# Patient Record
Sex: Male | Born: 1965 | Race: White | Hispanic: No | Marital: Married | Smoking: Never smoker
Health system: Southern US, Community
[De-identification: ages and names within clinical notes are randomized; demographics above are authoritative.]

## PROBLEM LIST (undated history)

## (undated) HISTORY — PX: TONSILLECTOMY: SUR1361

---

## 1998-10-20 ENCOUNTER — Inpatient Hospital Stay (HOSPITAL_COMMUNITY): Admission: EM | Admit: 1998-10-20 | Discharge: 1998-10-22 | Payer: Self-pay | Admitting: Emergency Medicine

## 1998-10-20 ENCOUNTER — Encounter: Payer: Self-pay | Admitting: General Surgery

## 2007-05-10 ENCOUNTER — Encounter: Admission: RE | Admit: 2007-05-10 | Discharge: 2007-05-10 | Payer: Self-pay | Admitting: Family Medicine

## 2008-10-31 ENCOUNTER — Encounter: Admission: RE | Admit: 2008-10-31 | Discharge: 2008-10-31 | Payer: Self-pay | Admitting: Family Medicine

## 2010-02-03 IMAGING — US US ABDOMEN COMPLETE
1 series · 14 of 25 positions shown · non-contrast
Comparison: None

CLINICAL DATA: Elevated LFTs

ABDOMEN ULTRASOUND
TECHNIQUE: Complete abdominal ultrasound examination was performed
including evaluation of the liver, gallbladder, bile ducts,
pancreas, kidneys, spleen, IVC, and abdominal aorta.

[Series 1: us abdomen complete · 0.36mm/px · 14 of 66 slices shown]
[im 1/66]
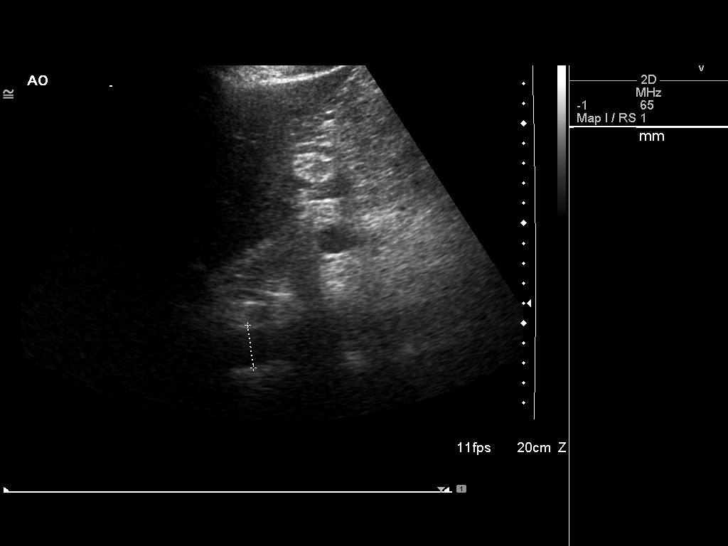
[im 6/66]
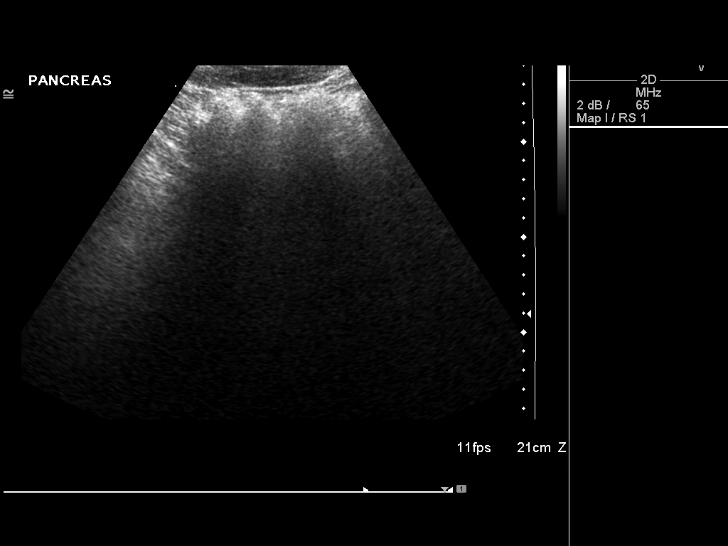
[im 11/66]
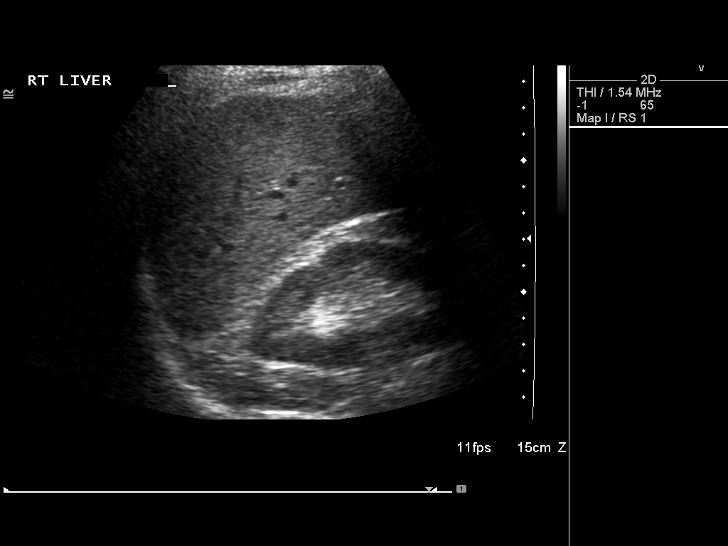
[im 17/66]
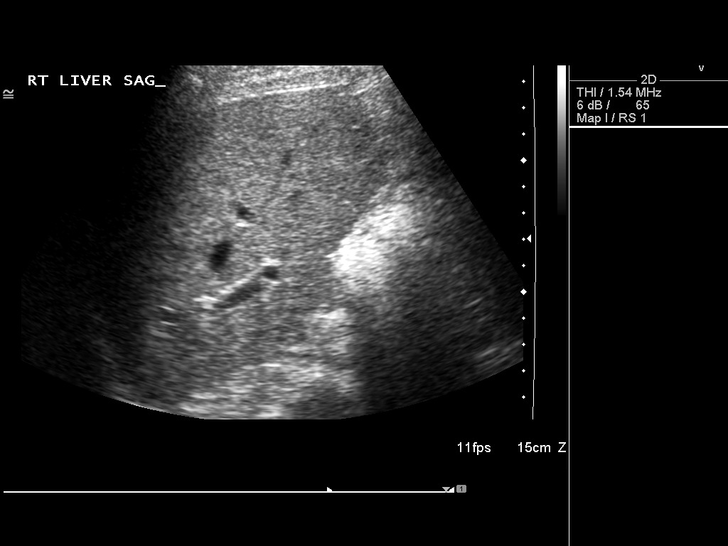
[im 22/66]
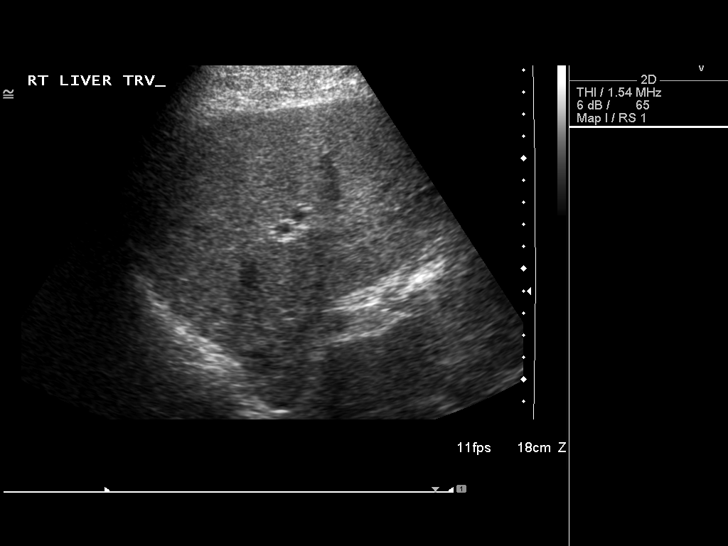
[im 25/66]
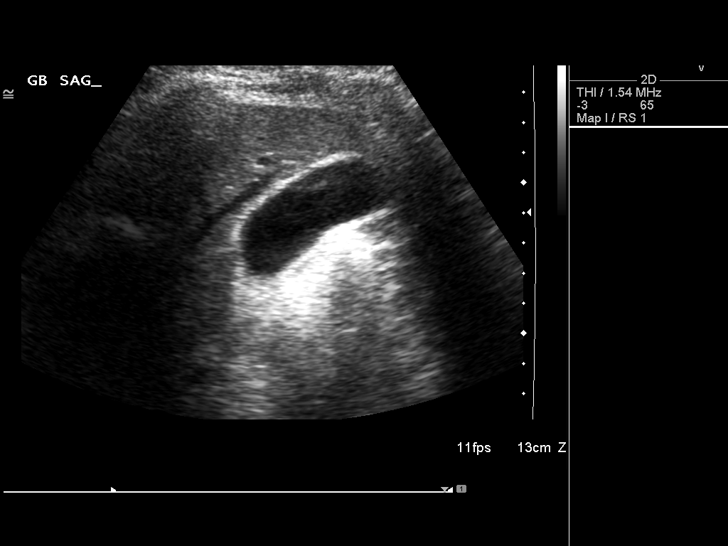
[im 30/66]
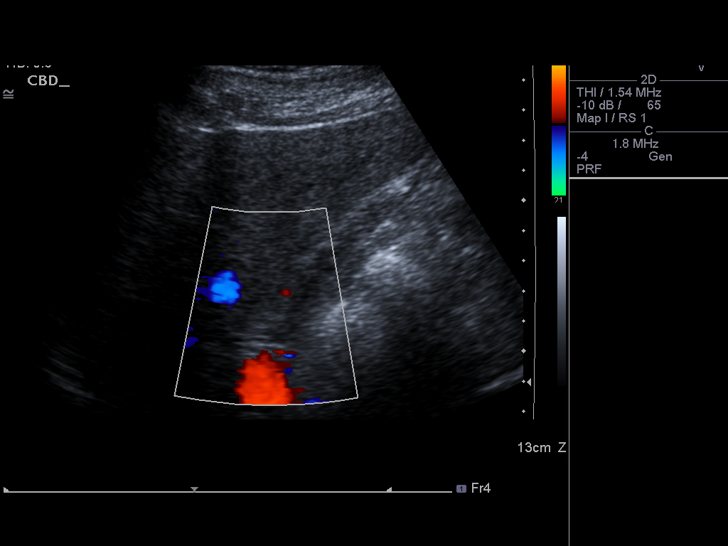
[im 36/66]
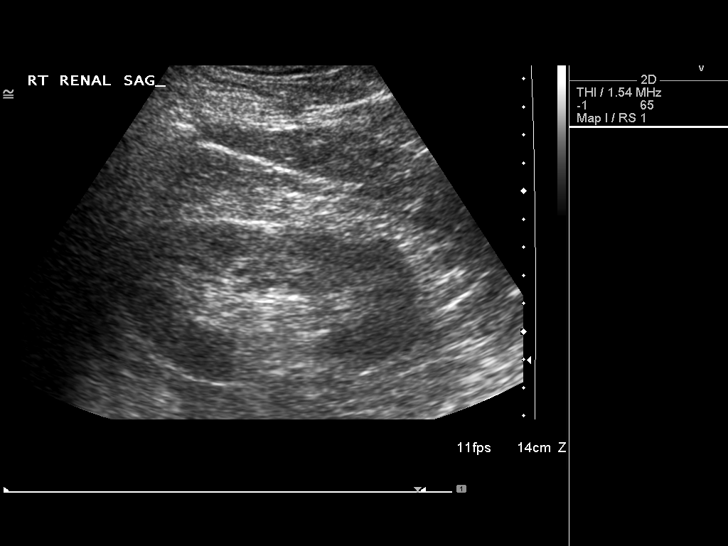
[im 41/66]
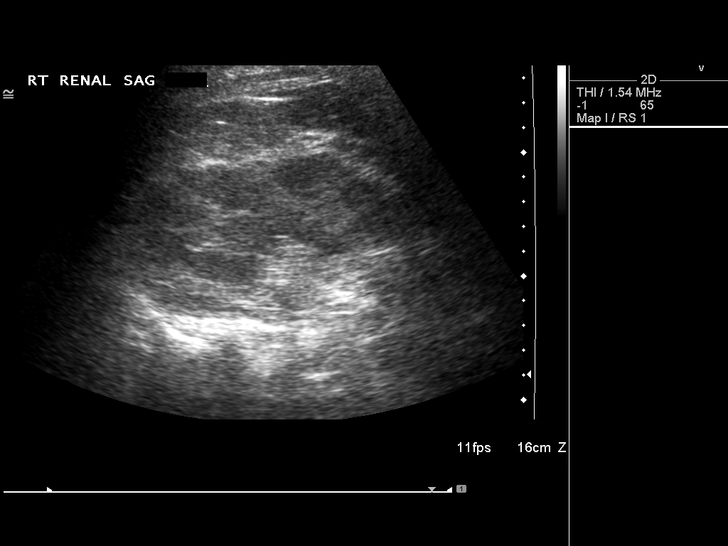
[im 44/66]
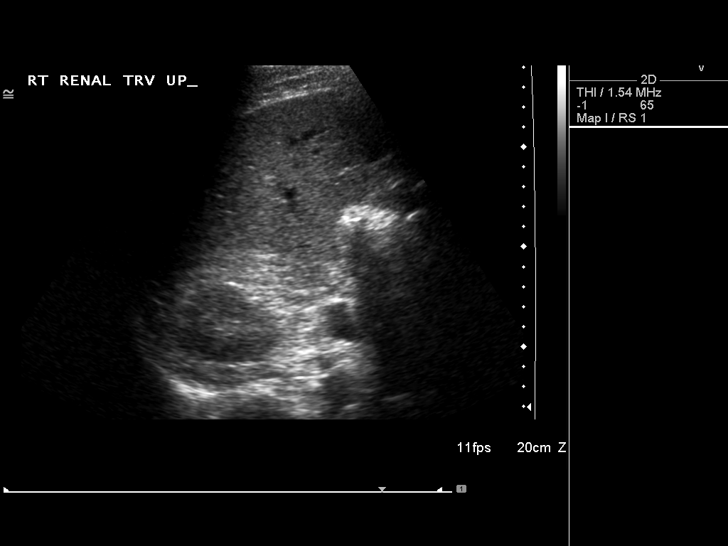
[im 49/66]
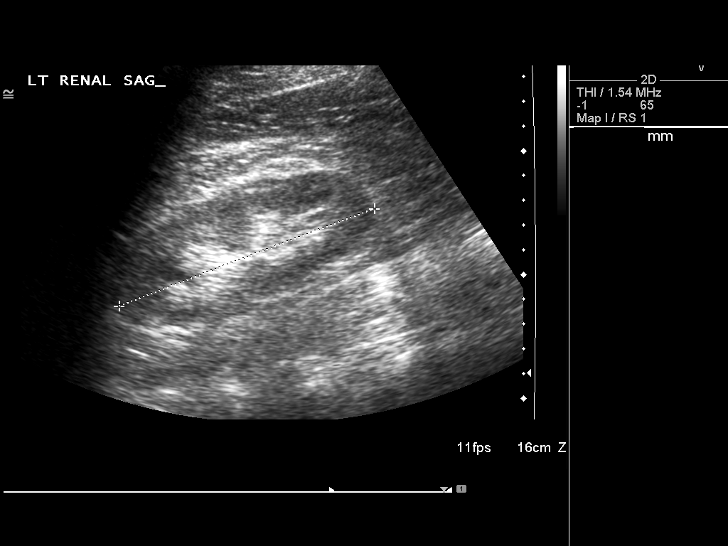
[im 55/66]
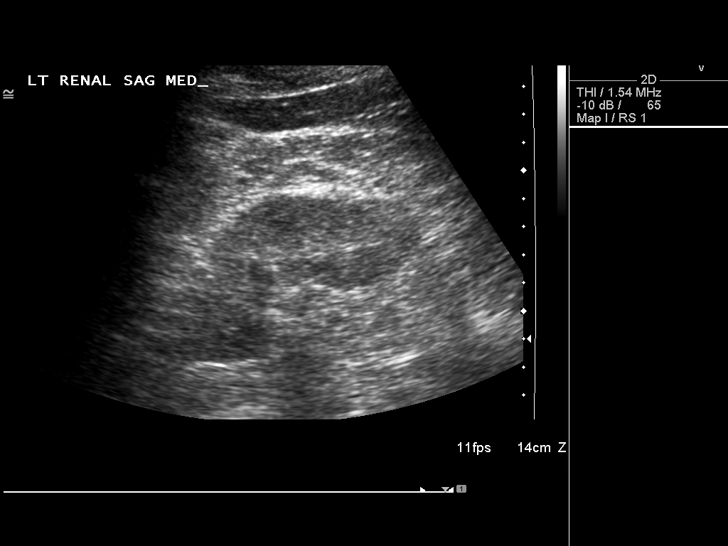
[im 60/66]
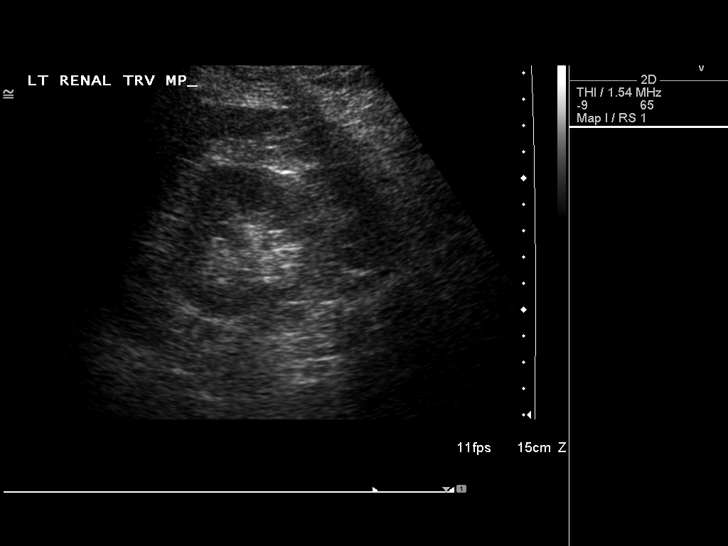
[im 66/66]
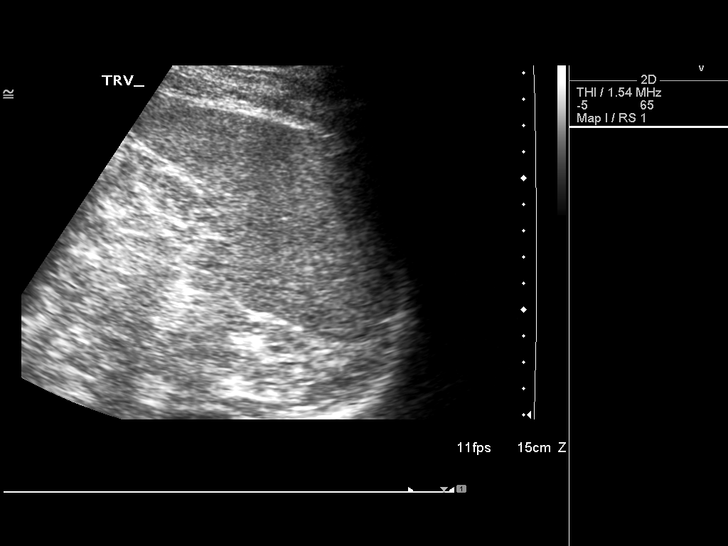

[14 of 25 positions shown; findings below may reference images not displayed]

FINDINGS: The gallbladder is normal.  Gallbladder wall thickness is
2.4 mm.  Common duct measures 2.8 mm in diameter which is within
normal limits.  No hepatic or splenic abnormality.  Patent IVC.
Visualization of the pancreas is obscured by adjacent bowel gas.
Spleen measures 8.0 cm in length.  Both kidneys measure 11.0 cm in
length.  There is a cyst in the upper pole of the left kidney
measuring 1.3 x 1.2 x 1.8 cm.  Maximum diameter of the abdominal
aorta is 2.1 cm.
IMPRESSION: Left renal cyst.  Suboptimal visualization of the pancreas.  The
exam is otherwise negative.

## 2014-05-29 ENCOUNTER — Emergency Department (HOSPITAL_COMMUNITY)
Admission: EM | Admit: 2014-05-29 | Discharge: 2014-05-29 | Disposition: A | Discharge status: Home / Self Care | Payer: BC Managed Care – PPO | Attending: Emergency Medicine | Admitting: Emergency Medicine

## 2014-05-29 ENCOUNTER — Encounter (HOSPITAL_COMMUNITY): Payer: Self-pay | Admitting: Emergency Medicine

## 2014-05-29 DIAGNOSIS — W4909XA Other specified item causing external constriction, initial encounter: Secondary | ICD-10-CM | POA: Insufficient documentation

## 2014-05-29 DIAGNOSIS — Y929 Unspecified place or not applicable: Secondary | ICD-10-CM | POA: Insufficient documentation

## 2014-05-29 DIAGNOSIS — M25442 Effusion, left hand: Secondary | ICD-10-CM

## 2014-05-29 DIAGNOSIS — T6391XA Toxic effect of contact with unspecified venomous animal, accidental (unintentional), initial encounter: Secondary | ICD-10-CM | POA: Insufficient documentation

## 2014-05-29 DIAGNOSIS — S60459A Superficial foreign body of unspecified finger, initial encounter: Secondary | ICD-10-CM | POA: Insufficient documentation

## 2014-05-29 DIAGNOSIS — Y9389 Activity, other specified: Secondary | ICD-10-CM | POA: Insufficient documentation

## 2014-05-29 DIAGNOSIS — T63461A Toxic effect of venom of wasps, accidental (unintentional), initial encounter: Secondary | ICD-10-CM | POA: Insufficient documentation

## 2014-05-29 NOTE — ED Notes (Addendum)
Pt states that he was stung on his left ring finger. Pt is unable to take his wedding ring off, and needs it removed.

## 2014-05-29 NOTE — Discharge Instructions (Signed)
May take benadryl and ice finger as needed to help with swelling. Return to the ED for new concerns.

## 2014-05-29 NOTE — ED Provider Notes (Signed)
CSN: [id number]     Arrival date & time 05/29/14  2011 History  This chart was scribed for non-physician practitioner, Sharilyn SitesLisa Janella Rogala, PA-C, working with Doug SouSam Jacubowitz, MD by Charline BillsEssence Howell, ED Scribe. This patient was seen in room TR05C/TR05C and the patient's care was started at 8:28 PM.   Chief Complaint  Patient presents with  . Illegal value: [    Ring removal  . Insect Bite   The history is provided by the patient. No language interpreter was used.   HPI Comments: Rodney Grant is a 48 y.o. male who presents to the Emergency Department complaining of bee sting that occurred yesterday to L ring finger. He reports associated swelling to his knuckle. Pt is unable to remove his wedding ring due to severity of swelling and requests that it be removed.  Denies numbness, paresthesias, or weakness of left hand.  No known allergy to bees.  Denies sensation of throat swelling, SOB, or chest pain.  History reviewed. No pertinent past medical history. Past Surgical History  Procedure Laterality Date  . Tonsillectomy     History reviewed. No pertinent family history. History  Substance Use Topics  . Smoking status: Never Smoker   . Smokeless tobacco: Not on file  . Alcohol Use: Yes    Review of Systems  Skin:       Bite  All other systems reviewed and are negative.  Allergies  Review of patient's allergies indicates no known allergies.  Home Medications   Prior to Admission medications   Medication Sig Start Date End Date Taking? Authorizing Provider  diphenhydrAMINE (BENADRYL) 25 MG tablet Take 50 mg by mouth 2 (two) times daily as needed for allergies.   Yes Historical Provider, MD  hydrocortisone 1 % ointment Apply 1 application topically 2 (two) times daily as needed for itching.   Yes Historical Provider, MD  ibuprofen (ADVIL,MOTRIN) 200 MG tablet Take 400 mg by mouth every 6 (six) hours as needed for moderate pain.   Yes Historical Provider, MD   Triage Vitals: BP 143/99   Pulse 72  Temp(Src) 97.9 F (36.6 C) (Oral)  Resp 16  Ht 5\' 10"  (1.778 m)  Wt 215 lb (97.523 kg)  BMI 30.85 kg/m2  SpO2 95% Physical Exam  Nursing note and vitals reviewed. Constitutional: He is oriented to person, place, and time. He appears well-developed and well-nourished.  HENT:  Head: Normocephalic and atraumatic.  Mouth/Throat: Oropharynx is clear and moist.  Airway patent, handling secretions appropriately, no difficulty swallowing or speaking; no facial or neck swelling  Eyes: Conjunctivae and EOM are normal. Pupils are equal, round, and reactive to light.  Neck: Normal range of motion.  Cardiovascular: Normal rate, regular rhythm and normal heart sounds.   Pulmonary/Chest: Effort normal and breath sounds normal.  Musculoskeletal: Normal range of motion.  Swelling of L ring finger PIP joint, limited flexion/extension of finger due to swelling No visible bites or signs of infection Strong radial pulse and cap refill; sensation intact diffusely throughout finger  Neurological: He is alert and oriented to person, place, and time.  Skin: Skin is warm and dry.  Psychiatric: He has a normal mood and affect.   ED Course  FOREIGN BODY REMOVAL Date/Time: 05/29/2014 9:42 PM Performed by: Garlon HatchetSANDERS, Morrison Masser M Authorized by: Garlon HatchetSANDERS, Folasade Mooty M Consent: Verbal consent obtained. Risks and benefits: risks, benefits and alternatives were discussed Consent given by: patient Patient understanding: patient states understanding of the procedure being performed Required items: required blood products, implants,  devices, and special equipment available Patient identity confirmed: verbally with patient Body area: skin General location: upper extremity Location details: left ring finger Patient sedated: no Removal mechanism: forceps 1 objects recovered. Objects recovered: ring Patient tolerance: Patient tolerated the procedure well with no immediate complications.   (including critical care  time) DIAGNOSTIC STUDIES: Oxygen Saturation is 95% on RA, normal by my interpretation.    COORDINATION OF CARE: 8:33 PM-Discussed treatment plan with pt at bedside and pt agreed to plan.   Labs Review Labs Reviewed - No data to display  Imaging Review No results found.   EKG Interpretation None      MDM   Final diagnoses:  Finger joint swelling, left   Bee sting with localized reaction.  No signs of infection.  Ring removed without difficulty using ring cutter, swelling gradually decreasing at this time.  Finger remains NVI.  Patient discharged home, recommended benadryl if needed.  Discussed plan with patient, he/she acknowledged understanding and agreed with plan of care.  Return precautions given for new or worsening symptoms.  I personally performed the services described in this documentation, which was scribed in my presence. The recorded information has been reviewed and is accurate.  Garlon Hatchet, PA-C 05/29/14 2145

## 2014-05-30 NOTE — ED Provider Notes (Signed)
Medical screening examination/treatment/procedure(s) were performed by non-physician practitioner and as supervising physician I was immediately available for consultation/collaboration.   EKG Interpretation None       Jadi Deyarmin, MD 05/30/14 0203 

## 2015-01-04 ENCOUNTER — Other Ambulatory Visit: Payer: Self-pay | Admitting: Dermatology

## 2017-11-20 DIAGNOSIS — Z23 Encounter for immunization: Secondary | ICD-10-CM | POA: Diagnosis not present

## 2017-11-20 DIAGNOSIS — Z131 Encounter for screening for diabetes mellitus: Secondary | ICD-10-CM | POA: Diagnosis not present

## 2017-11-20 DIAGNOSIS — Z136 Encounter for screening for cardiovascular disorders: Secondary | ICD-10-CM | POA: Diagnosis not present

## 2017-11-20 DIAGNOSIS — Z0000 Encounter for general adult medical examination without abnormal findings: Secondary | ICD-10-CM | POA: Diagnosis not present

## 2018-04-12 DIAGNOSIS — J029 Acute pharyngitis, unspecified: Secondary | ICD-10-CM | POA: Diagnosis not present

## 2018-11-22 DIAGNOSIS — Z13220 Encounter for screening for lipoid disorders: Secondary | ICD-10-CM | POA: Diagnosis not present

## 2018-11-22 DIAGNOSIS — Z0000 Encounter for general adult medical examination without abnormal findings: Secondary | ICD-10-CM | POA: Diagnosis not present

## 2019-05-16 DIAGNOSIS — Z01818 Encounter for other preprocedural examination: Secondary | ICD-10-CM | POA: Diagnosis not present

## 2019-06-17 DIAGNOSIS — D123 Benign neoplasm of transverse colon: Secondary | ICD-10-CM | POA: Diagnosis not present

## 2019-06-17 DIAGNOSIS — Z8371 Family history of colonic polyps: Secondary | ICD-10-CM | POA: Diagnosis not present

## 2019-06-17 DIAGNOSIS — Z1211 Encounter for screening for malignant neoplasm of colon: Secondary | ICD-10-CM | POA: Diagnosis not present

## 2019-09-29 DIAGNOSIS — Z03818 Encounter for observation for suspected exposure to other biological agents ruled out: Secondary | ICD-10-CM | POA: Diagnosis not present

## 2019-11-24 DIAGNOSIS — Z0000 Encounter for general adult medical examination without abnormal findings: Secondary | ICD-10-CM | POA: Diagnosis not present

## 2019-12-08 DIAGNOSIS — Z0000 Encounter for general adult medical examination without abnormal findings: Secondary | ICD-10-CM | POA: Diagnosis not present

## 2019-12-08 DIAGNOSIS — Z13220 Encounter for screening for lipoid disorders: Secondary | ICD-10-CM | POA: Diagnosis not present

## 2019-12-31 ENCOUNTER — Other Ambulatory Visit: Payer: Self-pay

## 2019-12-31 ENCOUNTER — Ambulatory Visit: Payer: Self-pay | Attending: Internal Medicine

## 2019-12-31 DIAGNOSIS — Z23 Encounter for immunization: Secondary | ICD-10-CM | POA: Insufficient documentation

## 2019-12-31 NOTE — Progress Notes (Signed)
   Covid-19 Vaccination Clinic  Name:  Rodney Grant    MRN: [id number] DOB: 1966-02-23  12/31/2019  Rodney Grant was observed post Covid-19 immunization for 15 minutes without incident. He was provided with Vaccine Information Sheet and instruction to access the V-Safe system.   Rodney Grant was instructed to call 911 with any severe reactions post vaccine: Marland Kitchen Difficulty breathing  . Swelling of face and throat  . A fast heartbeat  . A bad rash all over body  . Dizziness and weakness   Immunizations Administered    Name Date Dose VIS Date Route   Pfizer COVID-19 Vaccine 12/31/2019 10:45 AM 0.3 mL 10/07/2019 Intramuscular   Manufacturer: [employer], [employer]   Lot: [id number]   NDC: [address]-5

## 2020-01-24 ENCOUNTER — Ambulatory Visit: Payer: Self-pay | Attending: Internal Medicine

## 2020-01-24 DIAGNOSIS — Z23 Encounter for immunization: Secondary | ICD-10-CM

## 2020-01-24 NOTE — Progress Notes (Signed)
   Covid-19 Vaccination Clinic  Name:  ISAIAH CIANCI    MRN: [id number] DOB: [birth year]  01/24/2020  Mr. Chalmers was observed post Covid-19 immunization for 15 minutes without incident. He was provided with Vaccine Information Sheet and instruction to access the V-Safe system.   Mr. Russ was instructed to call 911 with any severe reactions post vaccine: Marland Kitchen Difficulty breathing  . Swelling of face and throat  . A fast heartbeat  . A bad rash all over body  . Dizziness and weakness   Immunizations Administered    Name Date Dose VIS Date Route   Pfizer COVID-19 Vaccine 01/24/2020 12:55 PM 0.3 mL 10/07/2019 Intramuscular   Manufacturer: [employer], [employer]   Lot: [id number] [id number]   NDC: [address]-2

## 2020-07-20 DIAGNOSIS — Z8579 Personal history of other malignant neoplasms of lymphoid, hematopoietic and related tissues: Secondary | ICD-10-CM | POA: Diagnosis not present

## 2020-07-20 DIAGNOSIS — L821 Other seborrheic keratosis: Secondary | ICD-10-CM | POA: Diagnosis not present

## 2020-07-20 DIAGNOSIS — Z1283 Encounter for screening for malignant neoplasm of skin: Secondary | ICD-10-CM | POA: Diagnosis not present

## 2020-07-20 DIAGNOSIS — D1801 Hemangioma of skin and subcutaneous tissue: Secondary | ICD-10-CM | POA: Diagnosis not present

## 2020-10-31 DIAGNOSIS — J029 Acute pharyngitis, unspecified: Secondary | ICD-10-CM | POA: Diagnosis not present

## 2020-10-31 DIAGNOSIS — R509 Fever, unspecified: Secondary | ICD-10-CM | POA: Diagnosis not present

## 2020-10-31 DIAGNOSIS — Z20822 Contact with and (suspected) exposure to covid-19: Secondary | ICD-10-CM | POA: Diagnosis not present

## 2020-10-31 DIAGNOSIS — J069 Acute upper respiratory infection, unspecified: Secondary | ICD-10-CM | POA: Diagnosis not present

## 2020-11-26 DIAGNOSIS — Z131 Encounter for screening for diabetes mellitus: Secondary | ICD-10-CM | POA: Diagnosis not present

## 2020-11-26 DIAGNOSIS — Z13220 Encounter for screening for lipoid disorders: Secondary | ICD-10-CM | POA: Diagnosis not present

## 2020-11-26 DIAGNOSIS — Z0000 Encounter for general adult medical examination without abnormal findings: Secondary | ICD-10-CM | POA: Diagnosis not present

## 2021-05-14 DIAGNOSIS — L918 Other hypertrophic disorders of the skin: Secondary | ICD-10-CM | POA: Diagnosis not present

## 2021-05-14 DIAGNOSIS — D224 Melanocytic nevi of scalp and neck: Secondary | ICD-10-CM | POA: Diagnosis not present

## 2021-05-14 DIAGNOSIS — L237 Allergic contact dermatitis due to plants, except food: Secondary | ICD-10-CM | POA: Diagnosis not present

## 2023-05-29 ENCOUNTER — Other Ambulatory Visit: Payer: Self-pay

## 2023-05-29 ENCOUNTER — Other Ambulatory Visit: Payer: Self-pay | Admitting: Physician Assistant

## 2023-05-29 ENCOUNTER — Ambulatory Visit
Admission: RE | Admit: 2023-05-29 | Discharge: 2023-05-29 | Disposition: A | Discharge status: Home / Self Care | Payer: Commercial Managed Care - PPO | Source: Ambulatory Visit | Attending: Physician Assistant | Admitting: Physician Assistant

## 2023-05-29 DIAGNOSIS — S99912A Unspecified injury of left ankle, initial encounter: Secondary | ICD-10-CM

## 2023-05-29 MED ORDER — DICLOFENAC SODIUM 50 MG PO TBEC
50.0000 mg | DELAYED_RELEASE_TABLET | Freq: Two times a day (BID) | ORAL | 0 refills | Status: AC
  Filled 2023-05-29: qty 30, 15d supply, fill #0

## 2023-05-29 MED ORDER — OXYCODONE-ACETAMINOPHEN 5-325 MG PO TABS
1.0000 | ORAL_TABLET | Freq: Two times a day (BID) | ORAL | 0 refills | Status: AC
  Filled 2023-05-29: qty 10, 5d supply, fill #0
